# Patient Record
Sex: Male | Born: 1965 | Race: White | Hispanic: No | Marital: Single | Smoking: Never smoker
Health system: Southern US, Community
[De-identification: ages and names within clinical notes are randomized; demographics above are authoritative.]

---

## 2003-08-05 ENCOUNTER — Emergency Department (HOSPITAL_COMMUNITY): Admission: EM | Admit: 2003-08-05 | Discharge: 2003-08-05 | Payer: Self-pay | Admitting: Emergency Medicine

## 2005-03-15 ENCOUNTER — Ambulatory Visit (HOSPITAL_BASED_OUTPATIENT_CLINIC_OR_DEPARTMENT_OTHER): Admission: RE | Admit: 2005-03-15 | Discharge: 2005-03-15 | Payer: Self-pay | Admitting: Family Medicine

## 2005-03-22 ENCOUNTER — Ambulatory Visit: Payer: Self-pay | Admitting: Internal Medicine

## 2008-12-22 ENCOUNTER — Inpatient Hospital Stay (HOSPITAL_COMMUNITY): Admission: EM | Admit: 2008-12-22 | Discharge: 2008-12-24 | Payer: Self-pay | Admitting: Emergency Medicine

## 2010-08-09 LAB — CBC
HCT: 41.7 % (ref 39.0–52.0)
MCHC: 34.4 g/dL (ref 30.0–36.0)
MCV: 96.1 fL (ref 78.0–100.0)
Platelets: 247 10*3/uL (ref 150–400)
RBC: 4.34 MIL/uL (ref 4.22–5.81)
RDW: 13.2 % (ref 11.5–15.5)

## 2010-08-09 LAB — PROTIME-INR
INR: 0.9 (ref 0.00–1.49)
INR: 1 (ref 0.00–1.49)
INR: 1 (ref 0.00–1.49)
Prothrombin Time: 12.4 seconds (ref 11.6–15.2)
Prothrombin Time: 12.6 seconds (ref 11.6–15.2)
Prothrombin Time: 13.1 seconds (ref 11.6–15.2)

## 2010-08-09 LAB — POCT I-STAT, CHEM 8
BUN: 19 mg/dL (ref 6–23)
Glucose, Bld: 102 mg/dL — ABNORMAL HIGH (ref 70–99)
HCT: 47 % (ref 39.0–52.0)
Potassium: 3.9 mEq/L (ref 3.5–5.1)
Sodium: 141 mEq/L (ref 135–145)

## 2010-08-09 LAB — DIFFERENTIAL
Basophils Absolute: 0.1 10*3/uL (ref 0.0–0.1)
Basophils Relative: 1 % (ref 0–1)
Eosinophils Relative: 0 % (ref 0–5)
Lymphocytes Relative: 14 % (ref 12–46)
Lymphs Abs: 1.5 10*3/uL (ref 0.7–4.0)

## 2010-08-09 LAB — GLUCOSE, CAPILLARY: Glucose-Capillary: 104 mg/dL — ABNORMAL HIGH (ref 70–99)

## 2010-09-16 NOTE — Consult Note (Signed)
NAME:  Johnny Franklin, Johnny Franklin             ACCOUNT NO.:  [id number]   MEDICAL RECORD NO.:  [id number]          PATIENT TYPE:  INP   LOCATION:  5020                         FACILITY:  [facility]   PHYSICIAN:  Georges Lynch. Gioffre, M.D.DATE OF BIRTH:  02-14-1966   DATE OF CONSULTATION:  DATE OF DISCHARGE:                                 CONSULTATION   He fell from a roof and sustained a closed comminuted fracture of the  distal tibia with a questionable fracture of the talus, nondisplaced.  He also had lacerations of the fingers of both hands.  There was no  tendon injury.  He was thoroughly cleaned up in the emergency room.  We  examined the lacerations and cleaned it up, Steri-Stripped him, and  dressed him.  He was placed in a short-leg cast for immobilization of  his tibial fracture.  The cast was bivalved bilaterally.  The next day,  December 23, 2008, he was up ambulating.  He ambulated, non-weightbearing  with a walker and crutches.  Note, he was placed on the Coumadin,  Lovenox protocol.   PERTINENT LABORATORY STUDIES:  His INR was 1.  He was seen again on  December 24, 2008, at which time, I elected to give him Lovenox 40 mg  subcu prior to discharge and he will remain on his Coumadin.  He will  see me in the office for a followup visit on Friday.  Hemoglobin on  admission was 14.3, white count 10.4, differential normal.  No other  real pertinent positive lab studies.  At this particular point,  instructions were given to him about ambulation, non-weightbearing,  elevate his leg at home, stay on his Coumadin 5 mg a day, and we will  see on Friday.           ______________________________  Georges Lynch. Darrelyn Hillock, M.D.     RAG/MEDQ  D:  12/24/2008  T:  12/24/2008  Job:  [id number]   cc:   Tammy R. Collins Scotland, M.D.

## 2010-09-16 NOTE — H&P (Signed)
NAME:  Johnny Franklin, Johnny Franklin             ACCOUNT NO.:  [id number]   MEDICAL RECORD NO.:  [id number]          PATIENT TYPE:  INP   LOCATION:  1829                         FACILITY:  [facility]   PHYSICIAN:  Georges Lynch. Gioffre, M.D.DATE OF BIRTH:  10/13/1965   DATE OF ADMISSION:  12/22/2008  DATE OF DISCHARGE:                              HISTORY & PHYSICAL   CHIEF COMPLAINT:  Fall with right lower leg pain.   HISTORY OF PRESENT ILLNESS:  Mr. Brawn is a 45 year old white male who  comes to the ER after a fall from his roof this afternoon.  The patient  is complaining of pain in his right lower leg.  The patient also has a  flap laceration to his right fourth finger.  The patient denies any loss  of consciousness with fall.  Dr. Darrelyn Hillock was paged by the emergency  department to consult and found that this patient has nondisplaced  interarticular fracture of the distal right tibia.  The patient also has  a possible nondisplaced right patellar fracture.  X-rays of the  patient's lumbar spine, pelvis, chest and C-spine were unremarkable.   HOME MEDICATIONS:  None.   ALLERGIES:  No known drug allergies.   CURRENT MEDICAL HISTORY:  The patient is otherwise healthy.   PAST SURGICAL HISTORY:  The patient has had reattachment of his left  index finger.   SOCIAL HISTORY:  The patient is married, lives with his wife.  The  patient denies smoking, states that he is a social drinker.  The patient  denies use of illegal drugs.   FAMILY MEDICAL HISTORY:  Noncontributory.   IMPRESSION:  Fracture right tibia,  laceration of right fourth finger  status post fall.   PLAN:  Patient is to be admitted to [facility] under the care  of Dr.  Windy Fast A. Gioffre.  The patient's right lower leg has been  casted and then split with  bone saw to form a splint.  The patient is  to be nonweightbearing as the fracture extends into the joint.  The  patient to have consultation with physical therapy tomorrow  morning.  Again the patient is to be nonweightbearing on his right lower leg.  Will start the patient on Coumadin today and follow-up x-rays have been  ordered for Monday      Rozell Searing, PAC    ______________________________  Georges Lynch. Darrelyn Hillock, M.D.   LD/MEDQ  D:  12/22/2008  T:  12/22/2008  Job:  [id number]

## 2010-09-16 NOTE — H&P (Signed)
NAME:  ISSAIC, WELLIVER             ACCOUNT NO.:  [id number]   MEDICAL RECORD NO.:  [id number]          PATIENT TYPE:  INP   LOCATION:  1829                         FACILITY:  [facility]   PHYSICIAN:  Georges Lynch. Gioffre, M.D.DATE OF BIRTH:  09-Jul-1965   DATE OF ADMISSION:  12/22/2008  DATE OF DISCHARGE:                              HISTORY & PHYSICAL   ADDENDUM.   PRIMARY CARE PHYSICIAN:  Tammy R. Collins Scotland, M.D.   REVIEW OF SYSTEMS:  GENERAL:  Negative for weight loss, fever or  fatigue.  HEENT: Negative for vision problems.  Negative for balance  problems.  RESPIRATORY: Negative for shortness of breath.  Negative for  cough.  Negative for wheeze.  CARDIOVASCULAR: Negative for chest pain.  Negative for palpitations.  GASTROINTESTINAL: Negative for blood in  stool.  Negative for nausea, vomiting, diarrhea.  GENITOURINARY:  Negative for frequent urinary tract infections.  MUSCULOSKELETAL:  Negative for arthritis.  Negative for joint pain.   PHYSICAL EXAMINATION:  VITAL SIGNS:  Blood pressure 118/80, pulse 80,  respirations 16.  GENERAL:  A 45 year old white male, alert and oriented x3, in no acute  distress.  The patient is a good historian.  HEENT:  Pupils are equally responsive and reactive to light.  Head is  normocephalic, atraumatic.  NECK:  Supple, full range of motion.  Negative for lymphadenopathy.  CHEST:  Lungs clear to auscultation bilaterally without wheezes, rales  or rhonchi.  HEART:  Regular rate and rhythm without murmur.  SPINE:  No tenderness with palpation along C-spine.  ABDOMEN:  Nontender with palpation.  No guarding, no distention.  Bowel  sounds positive all four quadrants.  Negative for any flank pain.  EXTREMITIES:  Positive for pain in the patient's right lower leg.  Calves are supple and soft.  The patient does have flap laceration to  the right fourth finger. No peripheral edema palpated.  PERIPHERAL VASCULAR:  Carotid pulses 2+ bilaterally without bruit.  Radial pulses 2+ bilaterally.  Dorsalis pedis pulses 2+ bilaterally.      Rozell Searing, PAC    ______________________________  Georges Lynch Darrelyn Hillock, M.D.    LD/MEDQ  D:  12/22/2008  T:  12/23/2008  Job:  [id number]

## 2010-09-19 NOTE — Procedures (Signed)
NAME:  Johnny Franklin, Johnny Franklin             ACCOUNT NO.:  [id number]   MEDICAL RECORD NO.:  [id number]          PATIENT TYPE:  OUT   LOCATION:  SLEEP CENTER                 FACILITY:  [facility]   PHYSICIAN:  Clinton D. Maple Hudson, M.D. DATE OF BIRTH:  07-07-1965   DATE OF STUDY:  03/15/2005                              NOCTURNAL POLYSOMNOGRAM   REFERRING PHYSICIAN:  Dr. Herb Grays.   DATE OF STUDY:  March 15, 2005.   INDICATION FOR STUDY:  Hypersomnia with sleep apnea.   EPWORTH SLEEPINESS SCORE:  5/24.   BMI:  26.   WEIGHT:  185 pounds.   No medications listed.   SLEEP ARCHITECTURE:  Total sleep time 342 minutes with sleep efficiency 81%.  Stage I was 8%, stage II 49%, stages III and IV 16%, REM 26% of total sleep  time. Sleep latency 43 minutes, REM latency 62 minutes, awake after sleep  onset 32 minutes, arousal index 14.9. No bedtime medication taken.   RESPIRATORY DATA:  Apnea/hypopnea index (AHI, RDI) 2.8 obstructive events  per hour which is within normal (0/5 per hour). There were 16 hypopneas. All  events were reported while supine. REM AHI 0 per hour.   OXYGEN DATA:  Mild snoring with oxygen desaturation to a nadir of 90%. Mean  oxygen saturation through the study was 94% on room air.   CARDIAC DATA:  Normal sinus rhythm.   MOVEMENT/PARASOMNIA:  A total of 78 limb jerks were reported of which 9 were  associated with arousal or awakening for a periodic limb movement with  arousal index of 1.6 per hour which is probably insignificant. No bathroom  trips.   IMPRESSION/RECOMMENDATIONS:  1.  Unremarkable sleep architecture for the sleep center environment.  2.  Occasional sleep disordered breathing events, AHI 2.8 per hour which is      within normal. Events were positional and some effort to sleep off flat      of back may be helpful at home.  3.  No obvious abnormality was noted on this study to explain the patient's      complaint of nonrestorative sleep. Sleep history and  discussion of      normal sleep habits may be useful.      Clinton D. Maple Hudson, M.D.  Diplomate, Biomedical engineer of Sleep Medicine  Electronically Signed     CDY/MEDQ  D:  03/22/2005 10:16:40  T:  03/22/2005 22:52:23  Job:  [id number]

## 2011-04-17 IMAGING — CR DG HAND COMPLETE 3+V*R*
3 series · 3 of 3 positions shown · non-contrast
Comparison: None

CLINICAL DATA: Fall, hand abrasions.

RIGHT HAND - COMPLETE 3+ VIEW

[x hand pa right]
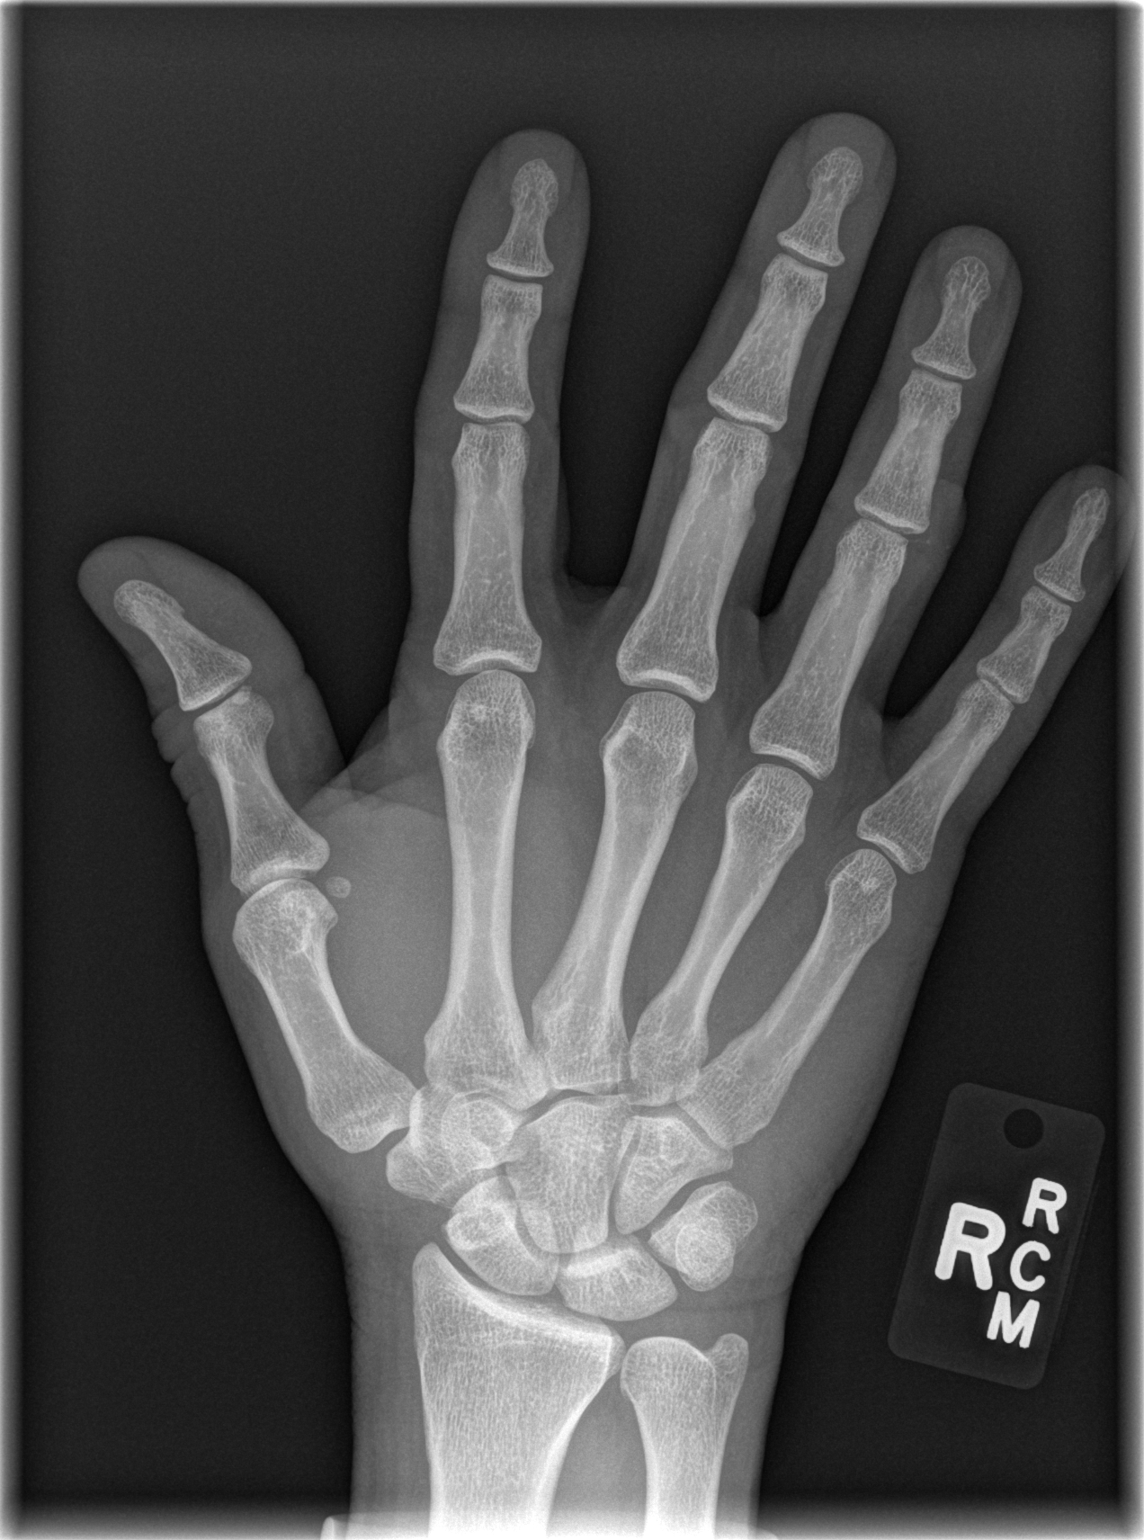

[x hand oblique right]
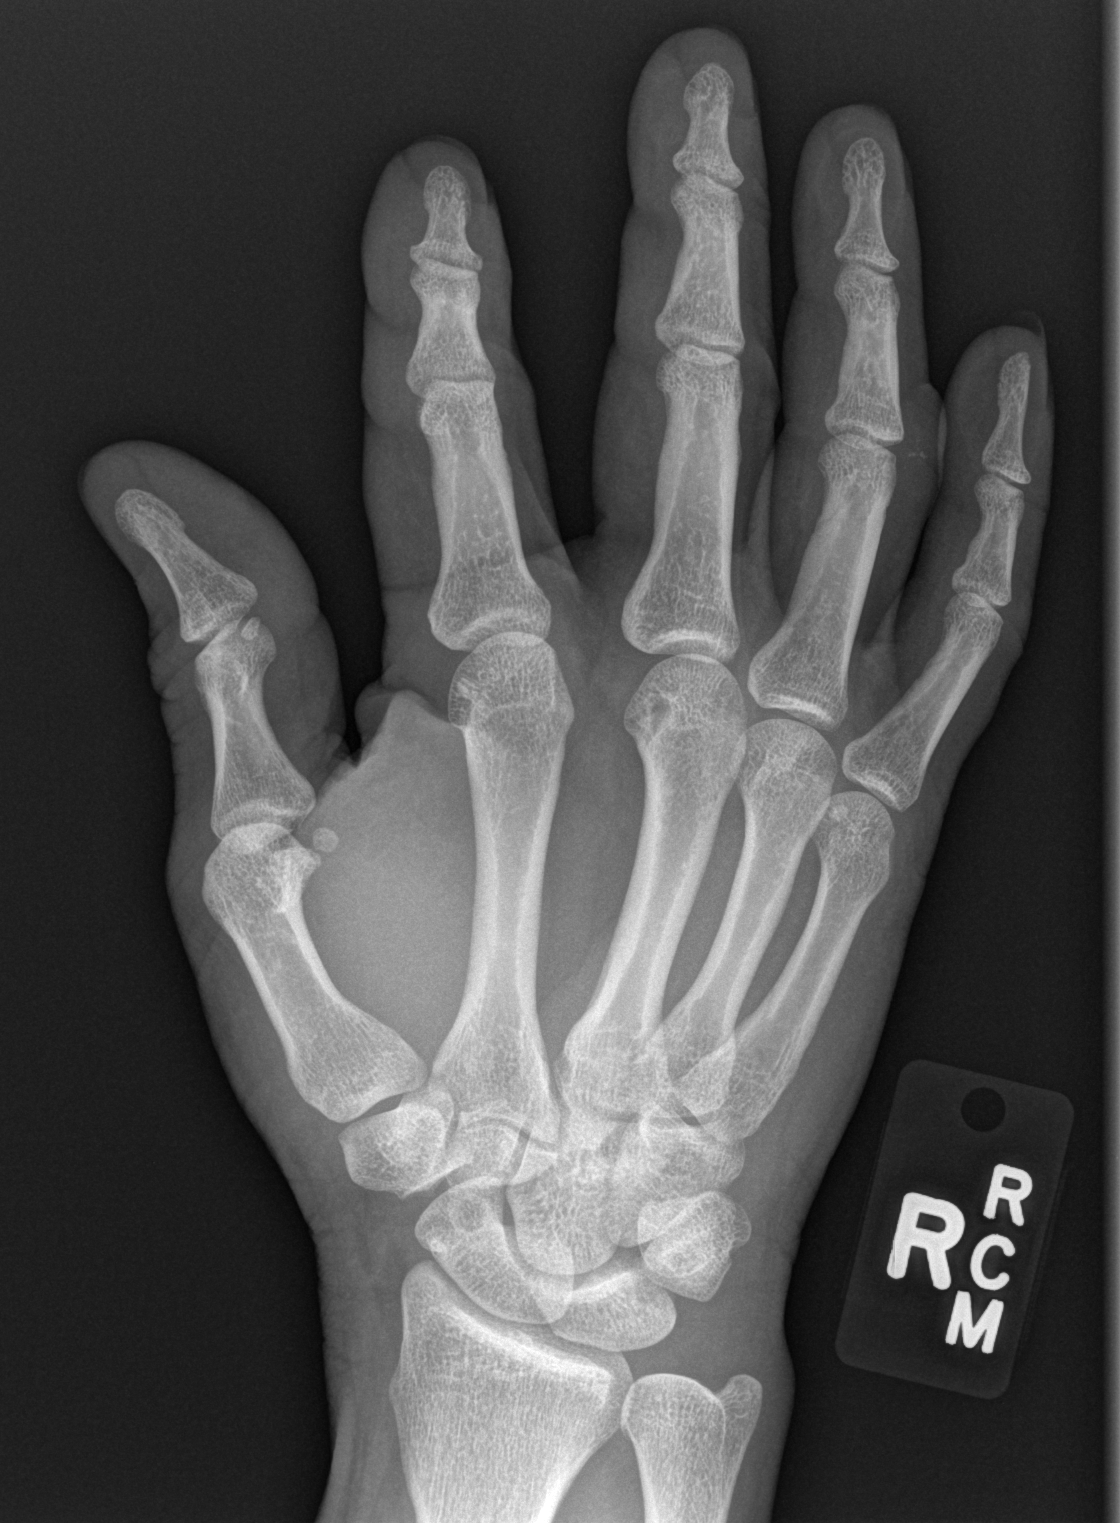

[x hand lat right]
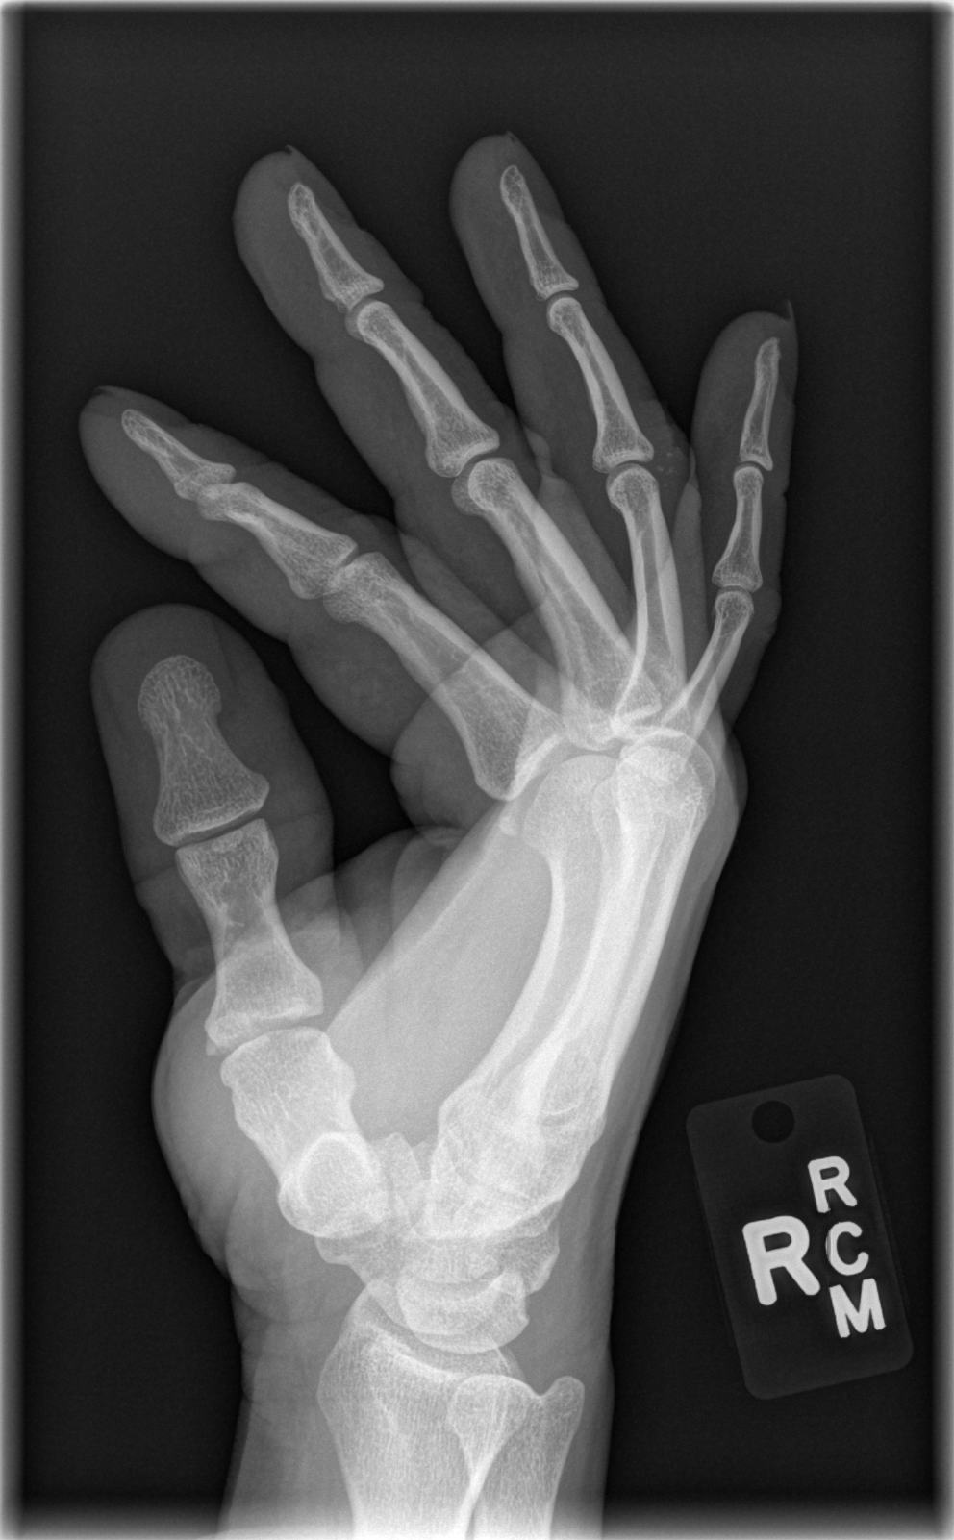

[3 of 3 positions shown; findings below may reference images not displayed]

FINDINGS: Radiopaque densities noted in the soft tissues posterior
to the PIP joint of the right ring finger.  No underlying bony
abnormality.  No fracture, subluxation, or dislocation.
IMPRESSION: No acute bony abnormality.

Radiopaque densities in the posterior soft tissues over the right
fourth PIP joint.

## 2018-05-16 ENCOUNTER — Encounter: Payer: Self-pay | Admitting: Cardiovascular Disease

## 2018-05-16 ENCOUNTER — Ambulatory Visit (INDEPENDENT_AMBULATORY_CARE_PROVIDER_SITE_OTHER): Payer: BLUE CROSS/BLUE SHIELD | Admitting: Cardiovascular Disease

## 2018-05-16 VITALS — BP 146/88 | HR 93 | Ht 70.0 in | Wt 218.0 lb

## 2018-05-16 DIAGNOSIS — G4730 Sleep apnea, unspecified: Secondary | ICD-10-CM | POA: Diagnosis not present

## 2018-05-16 DIAGNOSIS — E669 Obesity, unspecified: Secondary | ICD-10-CM

## 2018-05-16 DIAGNOSIS — I10 Essential (primary) hypertension: Secondary | ICD-10-CM

## 2018-05-16 DIAGNOSIS — E349 Endocrine disorder, unspecified: Secondary | ICD-10-CM

## 2018-05-16 NOTE — Progress Notes (Signed)
Cardiology Office Note    Date:  05/17/2018   ID:  Johnny Franklin, DOB [birth year], MRN [id number]  PCP:  Eartha Inch, MD  Cardiologist:  Nicki Guadalajara, MD   Chief Complaint  Patient presents with  . Shortness of Breath    with activity   Evaluate for sleep apnea  History of Present Illness:  Johnny Franklin is a 53 y.o. male who presents for initial evaluation.  He was advised to see me through his friend, Maryjean Ka who had previously worked with Dr. Rennis Golden.  Johnny Franklin is followed by Dr. Antony Haste for primary care at [facility].  He has a history of low testosterone and has been on supplemental testosterone replacement.  He has had difficulty with sleeping for many years.  He states he believes he had a sleep study well over 10 years ago which was done at the [facility].  At that time he was not advised of having significant sleep apnea and no further recommendations were made.  Presently, the patient admits to having loud snoring.  He frequently awakens from sleep gasping for breath.  His sleep is nonrestorative.  He admits to daytime sleepiness.  He typically goes to bed between 1030 and 11 PM at night and wakes up between 6 AM and 7 AM.  He denies any history of chest pain.  He walks on a treadmill.  At times he admits to rare palpitations.  At times he has noticed some short-lived sharp left-sided nonexertional discomfort which may last seconds to up to 10 minutes and ultimately resolve spontaneously.  He presents now for initial care and evaluation.   Epworth Sleepiness Scale: Situation   Chance of Dozing/Sleeping (0 = never , 1 = slight chance , 2 = moderate chance , 3 = high chance )   sitting and reading 2   watching TV 3   sitting inactive in a public place 1   being a passenger in a motor vehicle for an hour or more 1   lying down in the afternoon 2   sitting and talking to someone 1   sitting quietly after lunch (no alcohol) 2   while stopped for a  few minutes in traffic as the driver 0   Total Score  12     Past medical history is notable for testosterone deficiency, umbilical hernia without obstruction, history of carpal tunnel syndrome, history of prior LFT elevation.   Current Medications: Outpatient Medications Prior to Visit  Medication Sig Dispense Refill  . testosterone cypionate (DEPOTESTOSTERONE CYPIONATE) 200 MG/ML injection Inject 1 mL into the muscle once a week.     No facility-administered medications prior to visit.      Allergies:   Patient has no allergy information on record.   Social History   Socioeconomic History  . Marital status: Single    Spouse name: Not on file  . Number of children: Not on file  . Years of education: Not on file  . Highest education level: Not on file  Occupational History  . Not on file  Social Needs  . Financial resource strain: Not on file  . Food insecurity:    Worry: Not on file    Inability: Not on file  . Transportation needs:    Medical: Not on file    Non-medical: Not on file  Tobacco Use  . Smoking status: Never Smoker  . Smokeless tobacco: Never Used  Substance and Sexual Activity  . Alcohol use:  Not on file  . Drug use: Not on file  . Sexual activity: Not on file  Lifestyle  . Physical activity:    Days per week: Not on file    Minutes per session: Not on file  . Stress: Not on file  Relationships  . Social connections:    Talks on phone: Not on file    Gets together: Not on file    Attends religious service: Not on file    Active member of club or organization: Not on file    Attends meetings of clubs or organizations: Not on file    Relationship status: Not on file  Other Topics Concern  . Not on file  Social History Narrative  . Not on file    Socially he is married for 27 years.  He has 2 children.,  Sons age 52 who are twins.  He is a Chiropractorself-employed contractor.  He had smoked for approximately 20 years but quit in 2012.  Family History:  Family history is notable in that his mother is living at age 52.  Father died at age 52 with a heart attack.  His father was a Company secretaryfireman.  A paternal grandfather had heart disease.  He has 1 sister age 52 who is alive and well.  ROS General: Negative; No fevers, chills, or night sweats;  HEENT: Negative; No changes in vision or hearing, sinus congestion, difficulty swallowing Pulmonary: Negative; No cough, wheezing, shortness of breath, hemoptysis Cardiovascular: Negative; No chest pain, presyncope, syncope, palpitations GI: Negative; No nausea, vomiting, diarrhea, or abdominal pain GU: Negative; No dysuria, hematuria, or difficulty voiding Musculoskeletal: Negative; no myalgias, joint pain, or weakness Hematologic/Oncology: Negative; no easy bruising, bleeding Endocrine: Negative; no heat/cold intolerance; no diabetes Neuro: Negative; no changes in balance, headaches Skin: Negative; No rashes or skin lesions Psychiatric: Negative; No behavioral problems, depression Sleep: Poor sleep, loud snoring, frequent awakenings, awakening gasping for breath, daytime sleepiness; no bruxism, restless legs, hypnogognic hallucinations, no cataplexy Other comprehensive 14 point system review is negative.   PHYSICAL EXAM:   VS:  BP (!) 146/88 (BP Location: Left Arm)   Pulse 93   Ht 5\' 10"  (1.778 m)   Wt 218 lb (98.9 kg)   BMI 31.28 kg/m     Repeat blood pressure by me was 138/88  Wt Readings from Last 3 Encounters:  05/16/18 218 lb (98.9 kg)    General: Alert, oriented, no distress.  Skin: normal turgor, no rashes, warm and dry HEENT: Normocephalic, atraumatic. Pupils equal round and reactive to light; sclera anicteric; extraocular muscles intact; funduscopic exam without hemorrhages or exudates.  Discs flat. Nose without nasal septal hypertrophy Mouth/Parynx benign; Mallinpatti scale Neck: Thick neck, neck size greater than 17-1/2; no JVD, no carotid bruits; normal carotid upstroke Lungs: clear  to ausculatation and percussion; no wheezing or rales Chest wall: without tenderness to palpitation Heart: PMI not displaced, RRR, s1 s2 normal, 1/6 systolic murmur, no diastolic murmur, no rubs, gallops, thrills, or heaves Abdomen: soft, nontender; no hepatosplenomehaly, BS+; abdominal aorta nontender and not dilated by palpation. Back: no CVA tenderness Pulses 2+ Musculoskeletal: full range of motion, normal strength, no joint deformities Extremities: no clubbing cyanosis or edema, Homan's sign negative  Neurologic: grossly nonfocal; Cranial nerves grossly wnl Psychologic: Normal mood and affect   Studies/Labs Reviewed:   EKG:  EKG is ordered today.  ECG (independently read by me): Sinus rhythm at 93 bpm.  Nonspecific T changes.  Normal intervals.  Recent Labs: BMP Latest  Ref Rng & Units 12/22/2008  Glucose 70 - 99 mg/dL 956(O102(H)  BUN 6 - 23 mg/dL 19  Creatinine 0.4 - 1.5 mg/dL 1.1  Sodium 130135 - 865145 mEq/L 141  Potassium 3.5 - 5.1 mEq/L 3.9  Chloride 96 - 112 mEq/L 109     No flowsheet data found.  CBC Latest Ref Rng & Units 12/22/2008 12/22/2008  WBC 4.0 - 10.5 K/uL 10.4 -  Hemoglobin 13.0 - 17.0 g/dL 78.414.3 69.616.0  Hematocrit 29.539.0 - 52.0 % 41.7 47.0  Platelets 150 - 400 K/uL 247 -   Lab Results  Component Value Date   MCV 96.1 12/22/2008   No results found for: TSH No results found for: HGBA1C   BNP No results found for: BNP  ProBNP No results found for: PROBNP   Lipid Panel  No results found for: CHOL, TRIG, HDL, CHOLHDL, VLDL, LDLCALC, LDLDIRECT   RADIOLOGY: No results found.   Additional studies/ records that were reviewed today include:  I reviewed the [facility] records from Dr. Antony HasteMichael Badger  ASSESSMENT:    1. Sleep apnea, unspecified type   2. Hypertension, unspecified type   3. Testosterone deficiency   4. 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  He has a history of low testosterone  and has been on testosterone supplementation.  He admits to a long history of her sleep and apparently over 10 years ago had a sleep study and was not told of having significant sleep apnea at that time.  Recently, he admits to more difficulty with sleep.  His symptoms include loud snoring, awakening gasping for breath, nonrestorative sleep, and daytime sleepiness.  He has a thick neck.  I have recommended he undergo a diagnostic polysomnogram.  Ideally I have recommended that this be done in an in lab setting so that we can assess the severity of sleep apnea particularly with rem sleep.  However, if this is denied by his insurance a home study will be necessary.  I have recommended a complete set of fasting laboratory be obtained to assess to assess cardiovascular issues.  His blood pressure today was mildly increased and he is not on any antihypertensive medications.  I discussed with him the new hypertensive guidelines with target blood pressure less than 130/80.  We discussed atherosclerosis and potential for lipid lowering therapy to induce plaque stability and potential regression if present.  BMI is mildly increased and consistent with mild obesity at 31.28.  He keeps very busy with work and as result has not been able to exercise significantly.  We discussed current guidelines suggesting at least 30 minutes of exercise moderate intensity exercise 5 days/week.  I will see him in the office in follow-up of his above studies in 2 months and further recommendations will be made at that time.   Medication Adjustments/Labs and Tests Ordered: Current medicines are reviewed at length with the patient today.  Concerns regarding medicines are outlined above.  Medication changes, Labs and Tests ordered today are listed in the Patient Instructions below. Patient Instructions  Medication Instructions:  The current medical regimen is effective;  continue present plan and medications.  If you need a refill on your  cardiac medications before your next appointment, please call your pharmacy.   Labs: Come back for fasting labs.    Follow-Up: At [facility], you and your health needs are our priority.  As part of our continuing mission to provide you  with exceptional heart care, we have created designated Provider Care Teams.  These Care Teams include your primary Cardiologist (physician) and Advanced Practice Providers (APPs -  Physician Assistants and Nurse Practitioners) who all work together to provide you with the care you need, when you need it. You will need a follow up appointment in 2 months.  Please call our office 2 months in advance to schedule this appointment.  You may see Dr. or one of the following Advanced Practice Providers on your designated Care Team: Azalee Course, [place] . Micah Flesher, PA-C  Any Other Special Instructions Will Be Listed Below (If Applicable). Referral sent to Sleep Study, will call with instructions.        Signed, Nicki Guadalajara, MD  05/17/2018 3:33 PM    [facility] [address], [place], [place]  [address] Phone: [phone]

## 2018-05-16 NOTE — Patient Instructions (Signed)
Medication Instructions:  The current medical regimen is effective;  continue present plan and medications.  If you need a refill on your cardiac medications before your next appointment, please call your pharmacy.   Labs: Come back for fasting labs.    Follow-Up: At [facility], you and your health needs are our priority.  As part of our continuing mission to provide you with exceptional heart care, we have created designated Provider Care Teams.  These Care Teams include your primary Cardiologist (physician) and Advanced Practice Providers (APPs -  Physician Assistants and Nurse Practitioners) who all work together to provide you with the care you need, when you need it. You will need a follow up appointment in 2 months.  Please call our office 2 months in advance to schedule this appointment.  You may see Dr.Kelly or one of the following Advanced Practice Providers on your designated Care Team: Azalee Course, [place] . Micah Flesher, PA-C  Any Other Special Instructions Will Be Listed Below (If Applicable). Referral sent to Sleep Study, will call with instructions.

## 2018-05-17 ENCOUNTER — Encounter: Payer: Self-pay | Admitting: Cardiovascular Disease

## 2018-05-18 ENCOUNTER — Telehealth: Payer: Self-pay | Admitting: *Deleted

## 2018-05-18 NOTE — Telephone Encounter (Signed)
-----   Message from Darene Lamer, LPN sent at 9/52/8413  4:43 PM EST ----- Patient was ordered to have a sleep study done.  Thanks!

## 2018-05-18 NOTE — Telephone Encounter (Signed)
PA request for sleep study in lab submitted to [facility] via web portal.

## 2018-05-20 ENCOUNTER — Other Ambulatory Visit: Payer: Self-pay | Admitting: Cardiovascular Disease

## 2018-05-20 DIAGNOSIS — G4730 Sleep apnea, unspecified: Secondary | ICD-10-CM

## 2018-06-03 ENCOUNTER — Ambulatory Visit (HOSPITAL_BASED_OUTPATIENT_CLINIC_OR_DEPARTMENT_OTHER): Payer: BLUE CROSS/BLUE SHIELD | Attending: Cardiovascular Disease | Admitting: Cardiovascular Disease

## 2018-06-03 DIAGNOSIS — G4733 Obstructive sleep apnea (adult) (pediatric): Secondary | ICD-10-CM | POA: Diagnosis present

## 2018-06-03 DIAGNOSIS — R0902 Hypoxemia: Secondary | ICD-10-CM | POA: Diagnosis not present

## 2018-06-03 DIAGNOSIS — G4730 Sleep apnea, unspecified: Secondary | ICD-10-CM

## 2018-06-25 ENCOUNTER — Encounter (HOSPITAL_BASED_OUTPATIENT_CLINIC_OR_DEPARTMENT_OTHER): Payer: Self-pay | Admitting: Cardiovascular Disease

## 2018-06-25 NOTE — Procedures (Signed)
    Patient Name: Johnny Franklin, Johnny Franklin Date: 06/05/2018 Gender: Male D.O.B: [birth year] Age (years): 53 Referring Provider: Nicki Guadalajara MD, ABSM Height (inches): 70 Interpreting Physician: Nicki Guadalajara MD, ABSM Weight (lbs): 205 RPSGT: [place] BMI: 29 MRN: [id number] Neck Size: 16.00  CLINICAL INFORMATION Sleep Study Type: HST  Indication for sleep study: N/A  Epworth Sleepiness Score: 7  SLEEP STUDY TECHNIQUE A multi-channel overnight portable sleep study was performed. The channels recorded were: nasal airflow, thoracic respiratory movement, and oxygen saturation with a pulse oximetry. Snoring was also monitored.  MEDICATIONS testosterone cypionate (DEPOTESTOSTERONE CYPIONATE) 200 MG/ML injection Patient self administered medications include: N/A.  SLEEP ARCHITECTURE Patient was studied for 372.5 minutes. The sleep efficiency was 100.0 % and the patient was supine for 51.5%. The arousal index was 0.0 per hour.  RESPIRATORY PARAMETERS The overall AHI was 13.2 per hour, with a central apnea index of 0.0 per hour.  Supine sleep AHI was 21.3/h.   The oxygen nadir was 83% during sleep.  CARDIAC DATA Mean heart rate during sleep was 62.8 bpm.  IMPRESSIONS - Mild obstructive sleep apnea overall (AHI 13.2/h); however, sleep apnea was moderate duringsupine sleep (AHI 21.3/h). The severity of sleep apnea during REM sleep cannot be assessed on this home study. - No significant central sleep apnea occurred during this study (CAI = 0.0/h). - Moderate oxygen desaturation to a nadir of 83%. - Patient snored 33.0% during the sleep.  DIAGNOSIS - Obstructive Sleep Apnea (327.23 [G47.33 ICD-10]) - Nocturnal Hypoxemia (327.26 [G47.36 ICD-10])  RECOMMENDATIONS - Recommend an in-lab therapeutic CPAP titration to determine optimal pressure required to alleviate sleep disordered breathing and its effect on REM sleep. - Efforts should be made to optimize nasal and  oropharyngeal patency. - The patient should be counseled to avoid supine sleep; consider positional therapy. - Avoid alcohol, sedatives and other CNS depressants that may worsen sleep apnea and disrupt normal sleep architecture. - Sleep hygiene should be reviewed to assess factors that may improve sleep quality. - Weight management and regular exercise should be initiated or continued.  [Electronically signed] 06/25/2018 10:57 AM  Nicki Guadalajara MD, [facility], ABSM Diplomate, American Board of Sleep Medicine   NPI: [id number] [facility] PH: [phone]   FX: [phone] ACCREDITED BY THE [employer]Y OF SLEEP MEDICINE

## 2018-06-27 ENCOUNTER — Telehealth: Payer: Self-pay | Admitting: *Deleted

## 2018-06-27 ENCOUNTER — Other Ambulatory Visit: Payer: Self-pay | Admitting: Cardiovascular Disease

## 2018-06-27 DIAGNOSIS — IMO0002 Reserved for concepts with insufficient information to code with codable children: Secondary | ICD-10-CM

## 2018-06-27 DIAGNOSIS — G4736 Sleep related hypoventilation in conditions classified elsewhere: Secondary | ICD-10-CM

## 2018-06-27 DIAGNOSIS — G4733 Obstructive sleep apnea (adult) (pediatric): Secondary | ICD-10-CM

## 2018-06-27 NOTE — Telephone Encounter (Signed)
Patient returned a call to me and was give HST results and recommendations. He agrees to proceed with APAP. bcbs denied in lab titration sleep study.

## 2018-06-27 NOTE — Telephone Encounter (Signed)
Left message to return a call to discuss HST. 

## 2018-06-27 NOTE — Telephone Encounter (Signed)
-----   Message from Lennette Bihari, MD sent at 06/25/2018 11:01 AM EST ----- Johnny Franklin, please notify the patient the results of his home sleep study.  Ideally if in lab CPAP titration can be performed this is preferred.  However if insurance denies in lab titration, then initiate AutoPap therapy 6 to 20 cm with follow-up evaluation.
